# Patient Record
Sex: Male | Born: 1945 | Race: White | Hispanic: No | Marital: Single
Health system: Southern US, Community
[De-identification: ages and names within clinical notes are randomized; demographics above are authoritative.]

---

## 2003-08-23 ENCOUNTER — Inpatient Hospital Stay (HOSPITAL_COMMUNITY): Admission: EM | Admit: 2003-08-23 | Discharge: 2003-08-25 | Payer: Self-pay | Admitting: Emergency Medicine

## 2003-08-27 ENCOUNTER — Encounter: Admission: RE | Admit: 2003-08-27 | Discharge: 2003-08-27 | Payer: Self-pay | Admitting: Family Medicine

## 2006-09-08 ENCOUNTER — Inpatient Hospital Stay (HOSPITAL_COMMUNITY): Admission: EM | Admit: 2006-09-08 | Discharge: 2006-09-11 | Payer: Self-pay | Admitting: Emergency Medicine

## 2008-12-05 IMAGING — CR DG CHEST 1V PORT
1 series · 1 of 1 positions shown · non-contrast
Comparison: 08/23/2003

CLINICAL DATA: Shortness of breath

[view not recorded]
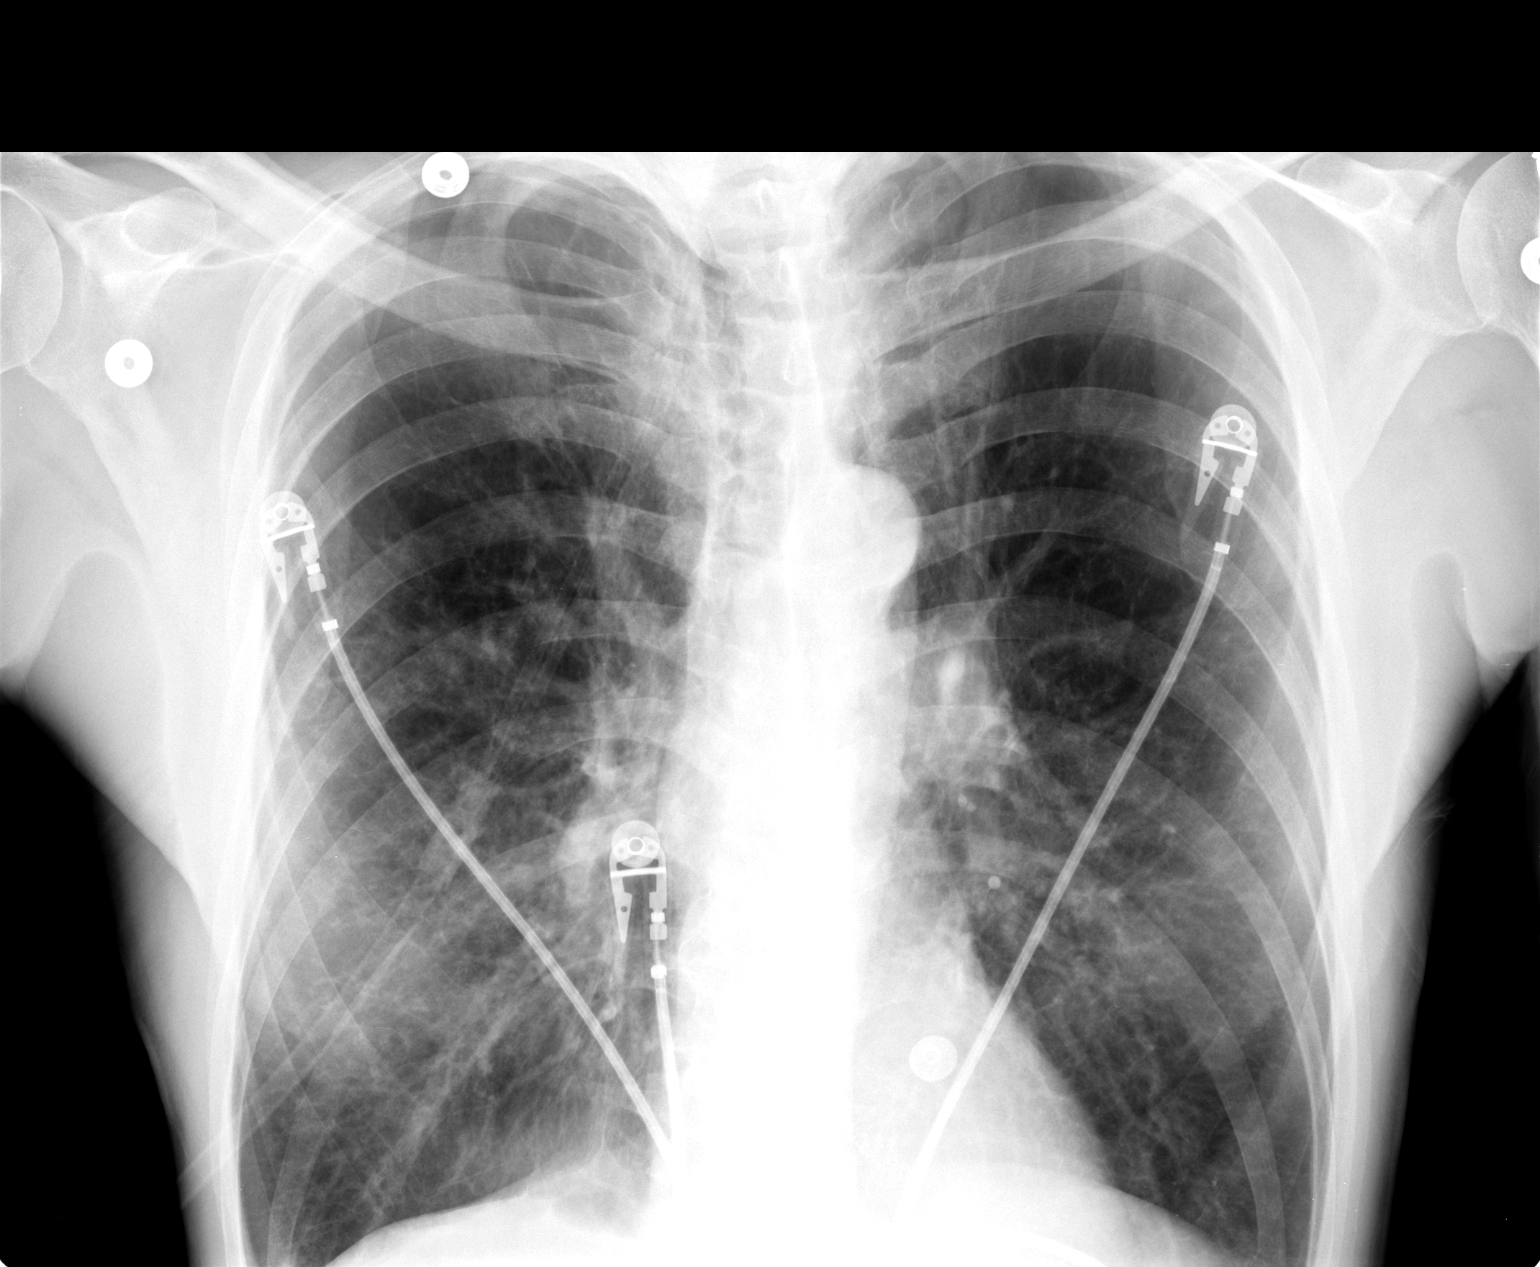

[1 of 1 positions shown; findings below may reference images not displayed]

PORTABLE CHEST - 1 VIEW:

9199 hours.  Lungs are hyperinflated as before. Soft tissue density projects
over the lateral inferior hemithoraces. Early infiltrate cannot be excluded in
the right parahilar region. The extreme costophrenic angles have not been
included on this film. The cardiopericardial silhouette is within normal limits
for size. Imaged bony structures of the thorax are intact. Telemetry leads
overlie the chest.
IMPRESSION: Emphysema. Question right parahilar infiltrate. This is not a definite finding.
A followup 2 view chest x-ray would likely prove helpful to further evaluate.

## 2008-12-07 IMAGING — CR DG CHEST 2V
2 series · 2 of 2 positions shown · non-contrast
Comparison: 09/08/06 as well as prior chest x-ray dated 08/23/03.

CLINICAL DATA: COPD and pneumonia. 
 CHEST - 2 VIEW ? 09/10/06:

[view not recorded (1 of 2)]
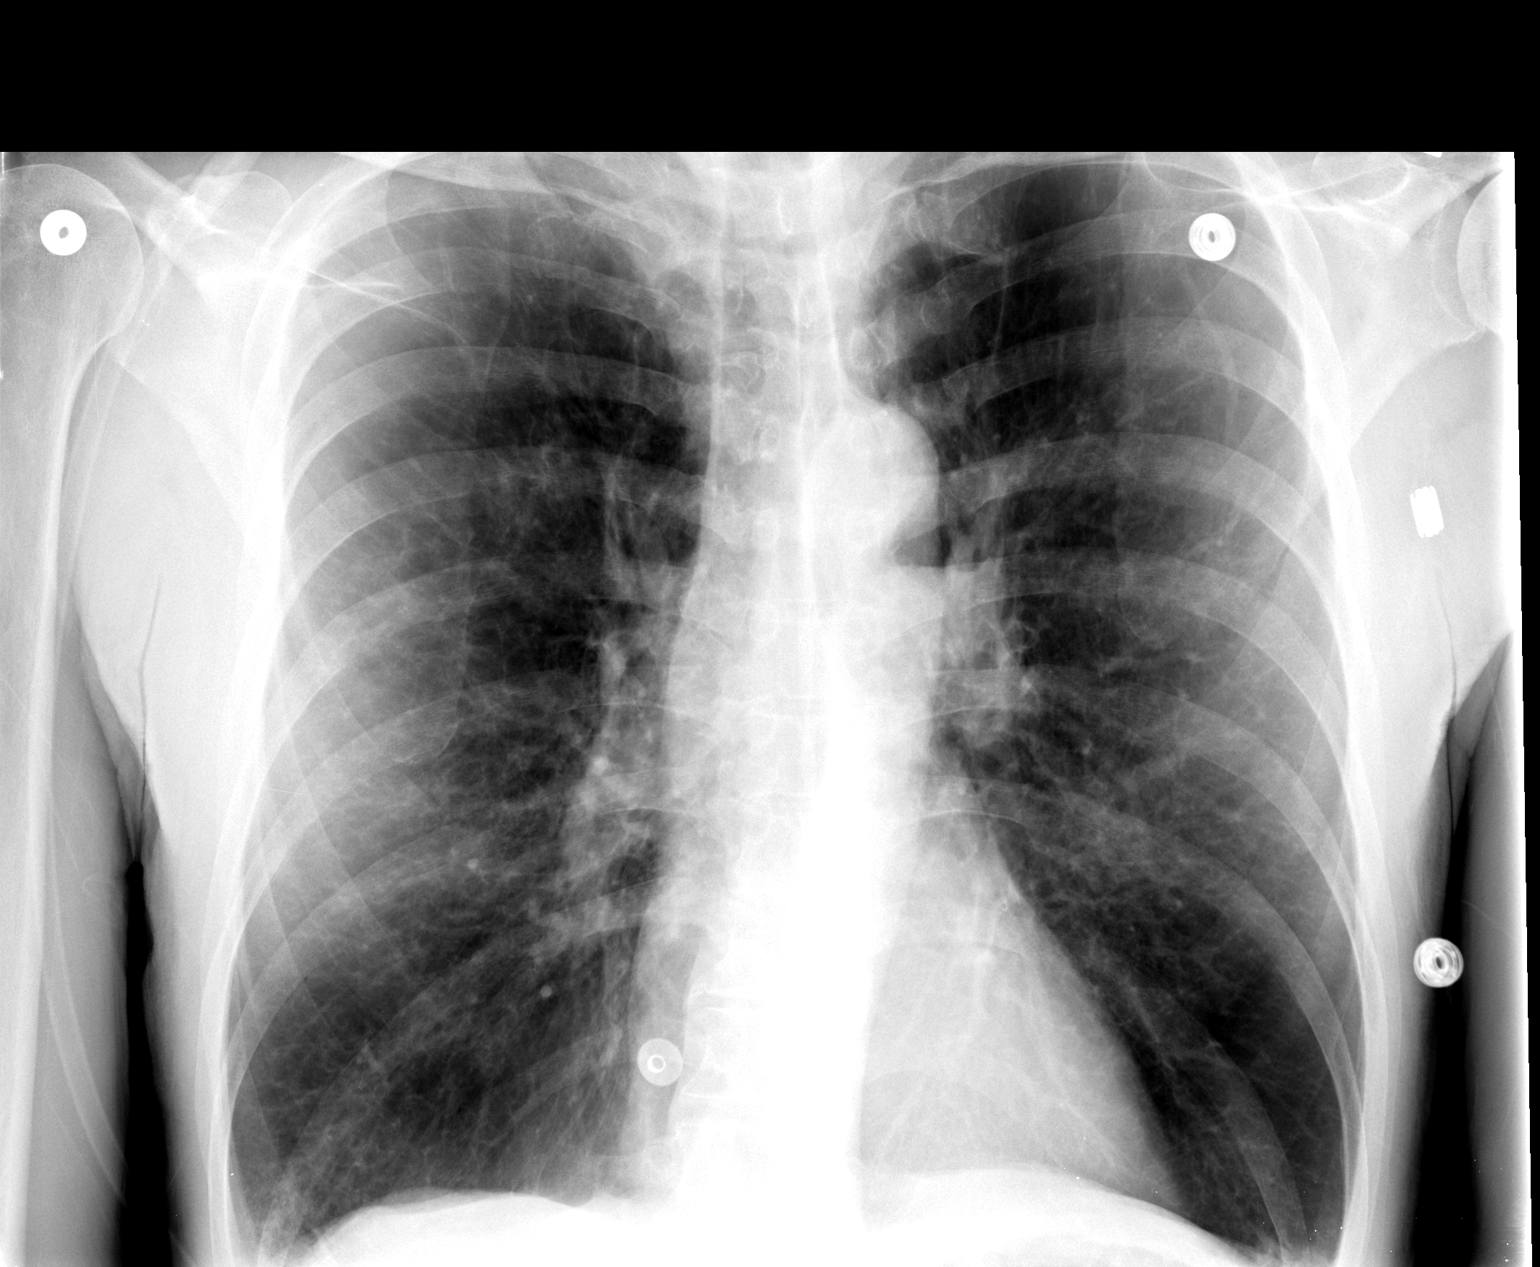

[view not recorded (2 of 2)]
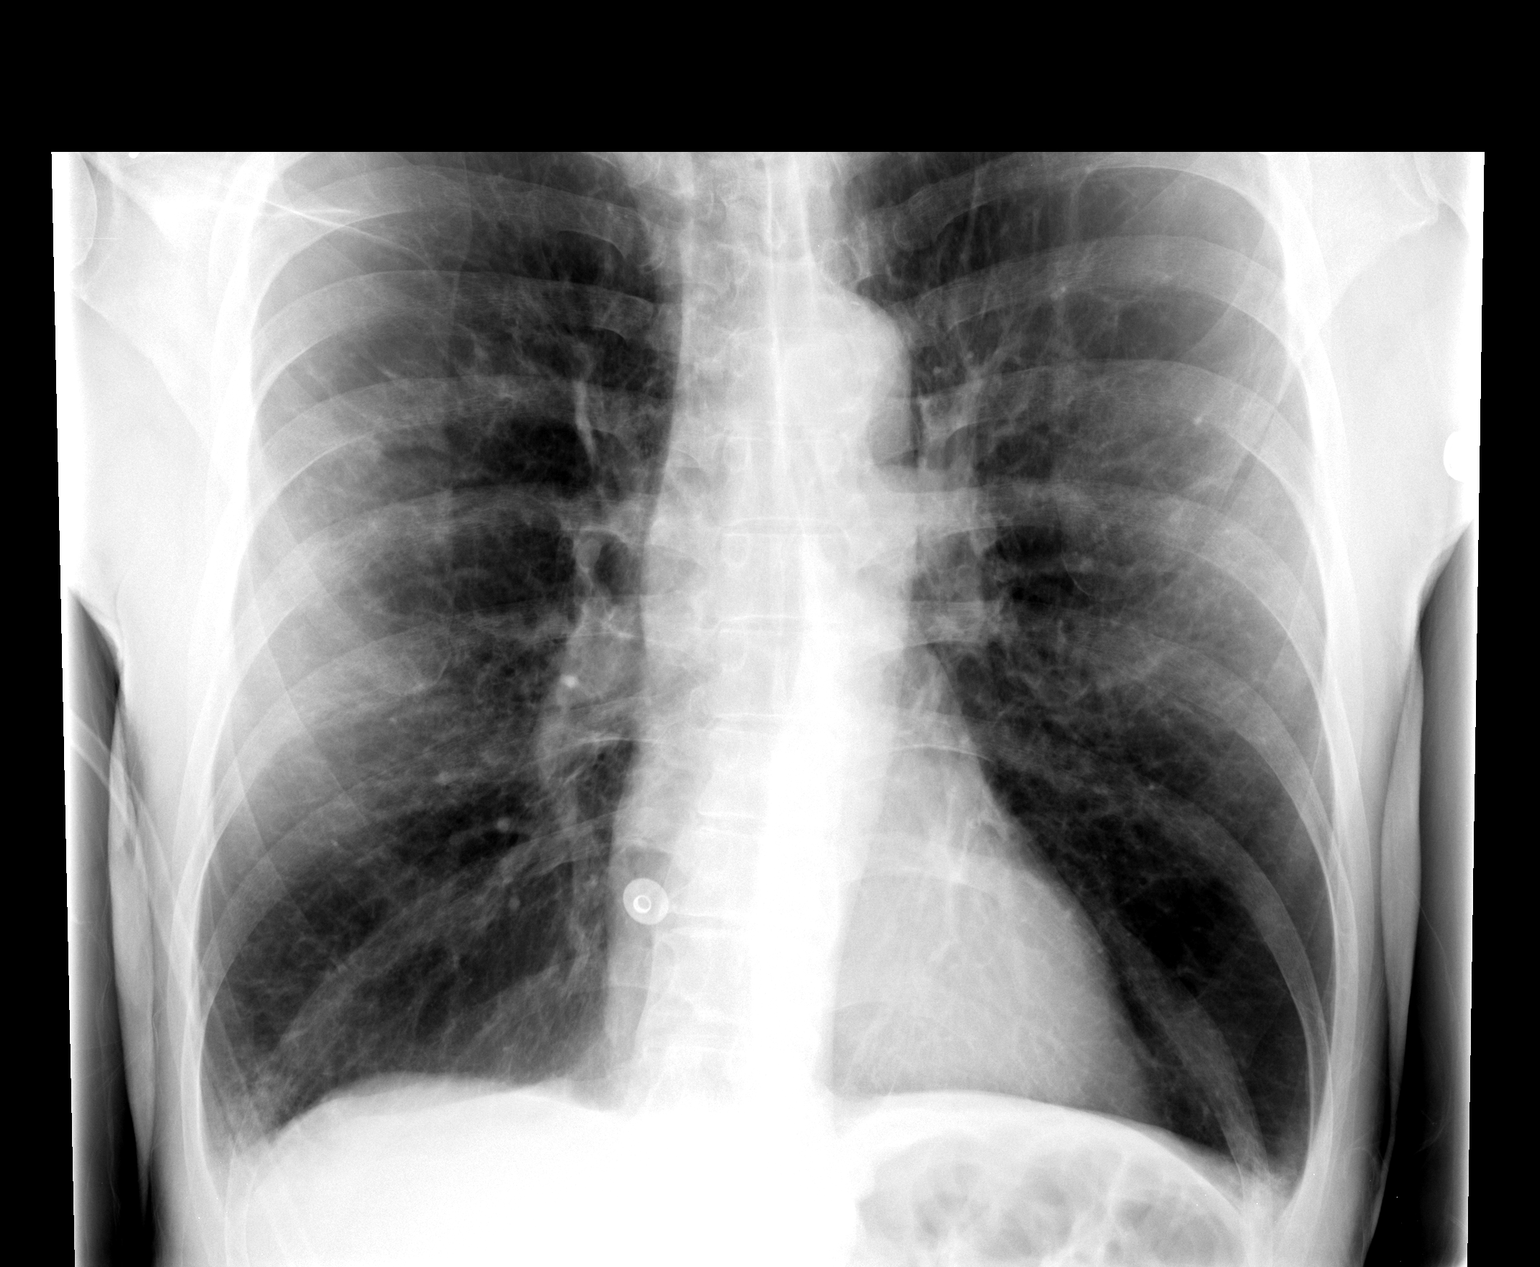

[2 of 2 positions shown; findings below may reference images not displayed]

FINDINGS: The vague increased reticular markings of the right lung have improved.  No progressive infiltrate is seen.  Severe COPD again noted. No edema or effusions.
IMPRESSION: Improved appearance of chest.  No progressive infiltrate is noted in the right lung.

## 2010-09-13 NOTE — H&P (Signed)
NAMEVICKY, Albert Diaz NO.:  [id number]   MEDICAL RECORD NO.:  [id number]          PATIENT TYPE:  INP   LOCATION:  5035                         FACILITY:  [facility]   PHYSICIAN:  Della Goo, M.D. DATE OF BIRTH:  06-15-1945   DATE OF ADMISSION:  09/08/2006  DATE OF DISCHARGE:                              HISTORY & PHYSICAL   PRIMARY CARE PHYSICIAN:  [facility].   CHIEF COMPLAINT:  Shortness of breath.   HISTORY OF PRESENT ILLNESS:  This is a 65 year old male who was brought  to the emergency department secondary to complaints of worsening  shortness of breath over the past 24 hours.  He reports being sick for 8  days with shortness of breath, productive cough of greenish sputum along  with fevers and chills.  The patient does report having chest discomfort  associated with his coughing.Marland Kitchen  He denies having any syncope, nausea,  vomiting, diarrhea, constipation.  He does report having joint pain.  He  denies having any diaphoresis, weight loss and myalgias.  He denies  having any genitourinary changes.   PAST MEDICAL HISTORY:  1. Chronic obstructive pulmonary disease.  2. Chronic bronchitis.  3. Type 2 diabetes mellitus.   PAST SURGICAL HISTORY:  Status post nasal polyp removal.   MEDICATIONS:  1. Advair discus 250/50 one inhalation q.12h.  2. DuoNeb treatments q.i.d. p.r.n.  3. 2.5 liters of nasal cannula oxygen p.r.n.   ALLERGIES:  NO KNOWN DRUG ALLERGIES   SOCIAL HISTORY:  The patient lives at home with his wife.  He has a past  history of tobacco abuse.  He quit 3 years ago and no history of alcohol  usage.   FAMILY HISTORY:  Positive for coronary artery disease in his brother,  hypertension in his father.  No history of diabetes mellitus in his  family that he knows of, and a positive history of cancer.  His sister  had lung cancer and was a smoker.   REVIEW OF SYSTEMS:  As mentioned above.   PHYSICAL EXAMINATION:  GENERAL:   This is a 65 year old cachectic-  appearing male in discomfort but no acute distress currently.  VITAL SIGNS:  Temperature 100.3, blood pressure 136/71, heart rate 110,  respirations 20-22.  O2 saturations 96-97%.  HEENT:  Normocephalic, atraumatic.  Pupils equally round and reactive to  light.  Extraocular muscles are intact.  Funduscopic benign.  Oropharynx  with sparse dentition.  NECK:  Supple, full range of motion.  No thyromegaly, adenopathy,  jugular venous distension.  CARDIOVASCULAR:  Tachycardiac rate and rhythm.  Distant heart sounds.  No murmurs, gallops or rubs.  LUNGS:  Decreased breath sounds bilaterally.  Occasional rhonchi.  No  rales, no wheezes.  ABDOMEN:  Positive bowel sounds, soft, nontender, nondistended.  EXTREMITIES:  Without edema.  GENITOURINARY:  Deferred.  RECTAL:  Deferred.  NEUROLOGIC:  The patient is alert and oriented x3, and there are no  focal deficits.   LABORATORY STUDIES:  White blood cell count 11.1, hemoglobin 13.2,  hematocrit 39.4, MCV 89.3, platelets 266.  Sodium 133, potassium 3.8,  chloride 97,  BUN 14, creatinine 0.9, glucose 112, bicarbonate of 35.6.  Chest x-ray findings reveal a right hilar infiltrate.   ASSESSMENT:  A 65 year old male being admitted with:  1. Shortness of breath.  2. Chronic obstructive pulmonary disease exacerbation.  3. Possible early pneumonia.  4. Mild hyperglycemia.   PLAN:  The patient has been administered IV antibiotic therapy of  Rocephin and azithromycin x1.  Avelox therapy, however, has been written  to continue 400 mg IV daily.  An IV steroid taper has also been ordered  along with nebulizer treatments and supplemental oxygen therapy.  The  patient has also been ordered DVT and GI prophylaxis.      Della Goo, M.D.  Electronically Signed     HJ/MEDQ  D:  09/09/2006  T:  09/10/2006  Job:  [id number]

## 2010-09-13 NOTE — Discharge Summary (Signed)
NAMEDANIS, PEMBLETON NO.:  [id number]   MEDICAL RECORD NO.:  [id number]          PATIENT TYPE:  INP   LOCATION:  5035                         FACILITY:  [facility]   PHYSICIAN:  Isidor Holts, M.D.  DATE OF BIRTH:  19-May-1945   DATE OF ADMISSION:  09/08/2006  DATE OF DISCHARGE:  09/11/2006                         DISCHARGE SUMMARY - REFERRING   PRIMARY MEDICAL DOCTOR:  Dr. Rema Fendt, [facility].   DISCHARGE DIAGNOSES:  1. Infective exacerbation of chronic obstructive pulmonary disease.  2. Acute bronchitis, with possible early pneumonia.  3. Questionable history of type 2 diabetes mellitus.   DISCHARGE MEDICATIONS:  1. Avelox 400 mg daily x10 days.  2. Mucinex 600 mg p.o. b.i.d. x1 week.  3. Prednisone 40 mg p.o. daily x3 days, then 30 mg p.o. daily x3 days,      then 20 mg daily x3 days, then 10 mg daily x3 days, then stop.  4. Advair Diskus 250/50 one puff b.i.d.  5. Albuterol nebulizer q.i.d.  6. Oxygen 3.5 liter per minute by nasal cannula.   PROCEDURES:  1. Portable chest x-ray dated Sep 08, 2006.  This showed emphysema,      questionable right parahilar infiltrate, not a definite finding.  2. Two view chest x-ray dated Sep 08, 2006.  This showed increased      vague reticular opacity, right greater than left.  Suspicious for      developing infectious pneumonitis.  No consolidation.  3. Two view chest x-ray Sep 10, 2006.  This showed improved appearance      of chest, no progressive infiltrate is noted in the right lung.   CONSULTATIONS:  None.   ADMISSION HISTORY:  As per H&P notes of Sep 08, 2006, dictated by Dr.  Della Goo.  However, in brief, this is a 65 year old male, with  known history of O2 dependent COPD on home oxygen supplementation,  questionable history of type 2 diabetes mellitus, who presents with an  eight-day history of increasing shortness of breath and cough productive  of greenish phlegm,  associated with fevers and chills as well as  associated chest discomfort.  In short, chest x-ray suggested possible  right parahilar infiltrate.  Patient was therefore admitted for further  evaluation and investigation and management.   CLINICAL COURSE:  PROBLEM #1 -  ACUTE BRONCHITIS, POSSIBLE EARLY  PNEUMONIA:  For details of presentation, refer to admission history  above.  Patient is managed with bronchodilator nebulizers, oxygen  supplementation and a combination of Rocephin and Azithromycin,  intravenously.  Chest x-rays of Sep 08, 2006, and Sep 10, 2006, showed  no clear pneumonia and certainly no progression of infiltrate.  Patient  was successfully transitioned to oral Avelox on Sep 10, 2006.  He is  anticipated to complete a 10-day course of this treatment.  Certainly  patient felt a lot better on Sep 11, 2006.  No pyrexia was reported  during his hospitalization.   PROBLEM #2 -  INFECTIVE EXACERBATION OF CHRONIC OBSTRUCTIVE PULMONARY  DISEASE:  This is secondary to #1 above.  This was managed with  antibiotics, steroid treatment, bronchodilator nebulizers and oxygen  supplementation, with good clinical effect.  By Sep 11, 2006, patient  was back to his baseline respiratory status.   PROBLEM #3 -  POSSIBLE TYPE 2 DIABETES MELLITUS:  This diagnosis is  somewhat questionable, as patient was not on any hypoglycemic  medications pre-admission.  At presentation his glucose was 112 and  hemoglobin A1c which was done during the course of this hospitalization  was normal at 5.7%.  Certainly CBGs during the hospitalization were  within normal limits.   DISPOSITION:  Patient was considered sufficiently clinically recovered  and stable to be discharged on Sep 11, 2006.   DIET:  Carbohydrate modified.   ACTIVITY:  As tolerated.   FOLLOW UP INSTRUCTIONS:  Patient instructed to follow up with his  primary medical doctor, Dr. Rema Fendt, next week.  He has been  instructed to  call for an appointment and has verbalized understanding.      Isidor Holts, M.D.  Electronically Signed     CO/MEDQ  D:  09/11/2006  T:  09/11/2006  Job:  [id number]   cc:   Rema Fendt, M.D.

## 2010-09-16 NOTE — H&P (Signed)
Albert Diaz, THRUN NO.:  [id number]   MEDICAL RECORD NO.:  [id number]                   PATIENT TYPE:  INP   LOCATION:  1826                                 FACILITY:  [facility]   PHYSICIAN:  Franklyn Lor, MD                      DATE OF BIRTH:  20-Dec-1945   DATE OF ADMISSION:  08/23/2003  DATE OF DISCHARGE:                                HISTORY & PHYSICAL   CHIEF COMPLAINT:  Shortness of air.   HISTORY OF PRESENTING ILLNESS:  This is a 65 year old white male with  virtually no primary care in the past, who reports with approximately a 12-  hour history of shortness of air and dyspnea on exertion.  He states that he  awoke at approximately 0300 on August 22, 2003 with shortness of air.  He  states that he has had episodes like this in the past that usually resolve  on their own, but this one was persistent.  He also admits to a  nonproductive cough and orthopnea.  He denies fever, nausea, vomiting.  The  patient does not like doctors and has no historical diagnosis of asthma or  allergies and thus has not been using any medicines for asthma or COPD.  He  does deny chest pain.   PAST MEDICAL HISTORY:  Again, the patient has not seen a doctor in  approximately 30 years but he denies any past surgery or any ongoing  treatment for medical conditions.   FAMILY HISTORY:  Family history of CVA; his father died at age of 12; he  also had a brother who had a CVA.  Hypertension -- 3 siblings and his  father.  Diabetes -- mom and 2 siblings.   SOCIAL HISTORY:  He is unemployed.  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  He has greater than a 20-  pack-year history of smoking but denies ethanol or recreational drug use.   REVIEW OF SYSTEMS:  No change in bowel or urine habits.  No rashes.   PHYSICAL EXAM:  VITAL SIGNS:  Temperature 98.1, respirations 24, heart rate  115, BP 109/75, 86% on 3 L.  GENERAL:  In  general, this is a cachectic white male who appears older than  his stated age who is sitting up at 90 degrees with obvious increased work  of breathing.  The patient had face mask on, receiving albuterol nebulizer.  He was alert, oriented x3 and interactive, able to engage easily in  conversation.  HEENT/NECK:  PERRL; EOMI; no scleral icterus; no lymphadenopathy; no  thyromegaly; oropharynx clear; nares clear.  CHEST:  Tachycardic.  No murmurs, rubs, or gallops.  Point of maximal  impulse displaced inferolaterally.  The patient had normal S1 and S2.  LUNGS:  Faint breath sounds.  No crackles, no wheeze, no rhonchi.  Patient's  inferior costal margins protruding bilaterally secondary to thin habitus.  ABDOMEN:  Abdomen was soft, nontender and nondistended with positive bowel  sounds.  EXTREMITIES:  No edema.  Radial and pedal pulses 1 to 2+ bilaterally.  No  cyanosis.  NEUROLOGIC:  Alert and oriented x3.  Cranial nerves II-XII intact.  RECTAL:  Exam was deferred.   LABORATORIES:  White count 15.7 with ANC of 13, hemoglobin 15.1, platelets  296,000.  ABG:  PCO2 47, PO2 143, bicarb 31, pH 7.4, 99% on 100% FIO2, base  excess of 5.  Sodium 142, potassium 4.4, chloride 104, bicarb 25, BUN 16,  creatinine 1.1, glucose 121, total bilirubin of 1, albumin 3.9, alkaline  phosphatase 83, AST 30, ALT 17, total protein 7.2, calcium 9.1.   EKG:  Unofficial read -- nonspecific questionable bundle branch block.   Chest x-ray showed flattened diaphragms with hyperinflation, no  consolidation; again, this is an unofficial reading.   Urinalysis is pending.   ASSESSMENT AND PLAN:  This is a 65 year old white male with virtually no  medical care in the past, with 1-day history of shortness of air.   Shortness of air.  Physical exam, history, chest x-ray and history of  smoking all consistent with obstructive pulmonary disease.  We will treat  the patient, based on gold criteria, prophylactic  antibiotics, specifically  doxycycline, especially given the increased white count.  Atrovent and  albuterol nebulizers q.6 h., oral prednisone 60 mg daily.  Also place PPD  in this cachectic-appearing male with no medical history.  Smoking cessation  counseling and nicotine patch.                                                Franklyn Lor, MD    TD/MEDQ  D:  08/23/2003  T:  08/24/2003  Job:  [id number]

## 2010-09-16 NOTE — Discharge Summary (Signed)
NAMEGUSTAVO, DISPENZA NO.:  [id number]   MEDICAL RECORD NO.:  [id number]                   PATIENT TYPE:  INP   LOCATION:  5733                                 FACILITY:  [facility]   PHYSICIAN:  Franklyn Lor, MD                      DATE OF BIRTH:  1945-08-24   DATE OF ADMISSION:  08/23/2003  DATE OF DISCHARGE:  08/25/2003                                 DISCHARGE SUMMARY   DISCHARGE DIAGNOSES:  1. Chronic obstructive pulmonary disease exacerbation.  2. Tobacco abuse.   DISCHARGE MEDICATIONS:  1. Prednisone 40 mg p.o. daily x10 days.  2. Doxycycline 100 mg one p.o. q.12h. x7 days.  3. Advair 250/50 one puff q.12h.  4. Albuterol M.D.I. two puffs q.4h. p.r.n. for wheezing, shortness of air,     not controlled by Advair.  5. Home O2 at 2 liters continuous to keep O2 saturations greater than 89%,     to be observed by home health nurse.   HISTORY OF PRESENTING ILLNESS:  This is a 65 year old white male with  virtually no primary care, who presented to the [facility] emergency  department on August 23, 2003 with an approximately 12-hour history of  shortness of air and dyspnea on exertion.  The patient states that he has  had episodes like this in the past that usually resolve on their own, but  because this one did not, he was prompted to come to the hospital.  He  admitted to a nonproductive cough and orthopnea, but denied fever, nausea,  vomiting, and chest pain.  The patient has not seen a doctor in greater than  30 years because he does not like doctors, and, as such, has no ongoing  diagnosis of asthma or allergies, and has not received treatment for either.   ADMISSION PHYSICAL EXAMINATION:  VITAL SIGNS:  Temperature 98.1,  respirations 24, heart rate 115, blood pressure 109/75, 86% on 3 liters.  GENERAL:  This is a cachectic-appearing white male who appears older than  his stated age, sitting up at 90 degrees, with obvious increased work of  breathing and a face mask on, receiving continuous albuterol treatments.  He  was alert and oriented x3, and able to engage in conversation fairly easily.  HEENT:  Pupils are equal, round and reactive to light.  Extraocular  movements intact.  No scleral icterus.  No lymphadenopathy.  No thyromegaly.  Oropharynx clear.  Nares clear.  No JVD.  CHEST:  Tachycardia.  No murmurs, rubs, or gallops.  Point of maximal  impulse displaced inferolaterally.  Normal S1 and S2.  LUNGS:  Faint breath sounds.  No crackles, no wheezes, no rhonchi.  ABDOMEN:  Benign.  EXTREMITIES:  No cyanosis.  There were 1 to 2+ radial and pedal pulses  bilaterally.  NEUROLOGIC:  Alert and oriented x3.  Cranial nerves II-XII intact.  RECTAL:  Deferred.   LABORATORY STUDIES:  White blood cell count 15.7 with an ANC of 13,  hemoglobin 15.1, platelets 296.  ABG showed PCO2 of 47, PO2 of 143, bicarb  of 31, pH of 7.4.  There was 99% O2 saturation on 100% FiO2.  Base excess of  5.  Sodium 142, potassium 4.4, chloride 104, bicarb 25, BUN 16, creatinine  1.1, glucose 121, total bilirubin 1, albumin 3.9, alkaline phosphatase 83,  AST 30, ALT 17, total protein 7.2, and calcium 9.1.  EKG showed a  questionable bundle branch block by unofficial read.  Chest x-ray showed  flattened diaphragms with hyperinflation.  No consolidation.  Again, this is  an unofficial reading.   HOSPITAL COURSE:  The patient was admitted to the family practice teaching  service to a regular bed, and initiated on treatment for COPD exacerbation  based on Gold's criteria.  This included albuterol nebulizer q.6h.  scheduled, with q.2h. p.r.n., Atrovent nebulizer q.6h., prednisone 60 mg  p.o. daily, O2 by nasal cannula to keep saturations greater than 92%,  doxycycline 100 mg p.o. b.i.d.  The patient had a repeat ABG which showed a  pH of 7.43, PCO2 of 40, PO2 of 60, bicarb 26, 91% on 40% FiO2.  Day #2 of  hospitalization, the patient was able to  ambulate, but continued to  desaturate to the low 80's without supplemental oxygen.  The patient's white  count decreased during his stay here, and on day #2 of hospital stay, the  patient had an O2 saturation of approximately 91% on room air while  stationary.  The patient was afebrile, vital signs stable otherwise, and  felt fit to be discharged to home with supplemental O2 and close follow up.  The patient discharged on the above medications.  The patient has no primary  care Caddie Randle, but his wife sees Theatre stage manager, and, as such,  [facility] is willing to see this patient.  He was  scheduled for a follow up appointment at [facility] on  Friday, August 28, 2003, with Dr. Duanne Guess at 3 o'clock.  It should be noted  that the patient received a Pneumovax while he was here, and he also had a  PPD placed that was nonreactive.                                                Franklyn Lor, MD    TD/MEDQ  D:  08/25/2003  T:  08/25/2003  Job:  [id number]   cc:   Maryelizabeth Rowan, M.D.  [facility]  [place], [place] [address]  Fax: [phone]

## 2013-06-01 DEATH — deceased

## 2013-07-01 ENCOUNTER — Telehealth: Payer: Self-pay

## 2013-07-01 NOTE — Telephone Encounter (Signed)
Patient past away per Obituary in [place] [employer] °
